# Patient Record
Sex: Female | Born: 1935 | Race: White | Hispanic: No
Health system: Southern US, Community
[De-identification: ages and names within clinical notes are randomized; demographics above are authoritative.]

---

## 2005-07-26 ENCOUNTER — Ambulatory Visit: Payer: Self-pay | Admitting: Internal Medicine

## 2005-11-04 ENCOUNTER — Ambulatory Visit: Payer: Self-pay | Admitting: Internal Medicine

## 2006-08-04 ENCOUNTER — Ambulatory Visit: Payer: Self-pay | Admitting: Internal Medicine

## 2008-07-08 ENCOUNTER — Other Ambulatory Visit: Payer: Self-pay

## 2008-07-08 ENCOUNTER — Emergency Department: Payer: Self-pay | Admitting: Emergency Medicine

## 2008-07-10 ENCOUNTER — Ambulatory Visit: Payer: Self-pay | Admitting: Internal Medicine

## 2009-03-27 ENCOUNTER — Ambulatory Visit: Payer: Self-pay | Admitting: Unknown Physician Specialty

## 2009-09-29 ENCOUNTER — Ambulatory Visit: Payer: Self-pay | Admitting: Internal Medicine

## 2009-12-02 ENCOUNTER — Ambulatory Visit: Payer: Self-pay | Admitting: Cardiology

## 2009-12-02 ENCOUNTER — Ambulatory Visit: Payer: Self-pay | Admitting: Ophthalmology

## 2009-12-14 ENCOUNTER — Ambulatory Visit: Payer: Self-pay | Admitting: Ophthalmology

## 2010-05-28 ENCOUNTER — Ambulatory Visit: Payer: Self-pay | Admitting: Internal Medicine

## 2010-10-22 ENCOUNTER — Ambulatory Visit: Payer: Self-pay | Admitting: Internal Medicine

## 2011-07-28 ENCOUNTER — Ambulatory Visit: Payer: Self-pay | Admitting: Internal Medicine

## 2013-03-29 ENCOUNTER — Emergency Department: Payer: Self-pay | Admitting: Emergency Medicine

## 2013-03-29 LAB — URINALYSIS, COMPLETE
Bacteria: NONE SEEN
Bilirubin,UR: NEGATIVE
Ph: 5 (ref 4.5–8.0)
RBC,UR: 7 /HPF (ref 0–5)
Specific Gravity: 1.023 (ref 1.003–1.030)
Squamous Epithelial: NONE SEEN

## 2013-03-29 LAB — BASIC METABOLIC PANEL
Chloride: 109 mmol/L — ABNORMAL HIGH (ref 98–107)
Co2: 27 mmol/L (ref 21–32)
Creatinine: 1.9 mg/dL — ABNORMAL HIGH (ref 0.60–1.30)
EGFR (African American): 29 — ABNORMAL LOW
Sodium: 142 mmol/L (ref 136–145)

## 2013-03-29 LAB — HEPATIC FUNCTION PANEL A (ARMC)
Albumin: 3.3 g/dL — ABNORMAL LOW (ref 3.4–5.0)
Bilirubin,Total: 0.9 mg/dL (ref 0.2–1.0)

## 2013-03-29 LAB — CBC
HGB: 11.9 g/dL — ABNORMAL LOW (ref 12.0–16.0)
Platelet: 172 10*3/uL (ref 150–440)
RDW: 13.4 % (ref 11.5–14.5)

## 2013-03-29 LAB — LIPASE, BLOOD: Lipase: 105 U/L (ref 73–393)

## 2013-04-04 LAB — CULTURE, BLOOD (SINGLE)

## 2013-08-11 ENCOUNTER — Emergency Department: Payer: Self-pay | Admitting: Emergency Medicine

## 2013-08-11 LAB — COMPREHENSIVE METABOLIC PANEL
Albumin: 3.5 g/dL (ref 3.4–5.0)
Alkaline Phosphatase: 76 U/L (ref 50–136)
Anion Gap: 5 — ABNORMAL LOW (ref 7–16)
BUN: 28 mg/dL — ABNORMAL HIGH (ref 7–18)
Bilirubin,Total: 0.4 mg/dL (ref 0.2–1.0)
Calcium, Total: 8.8 mg/dL (ref 8.5–10.1)
Chloride: 110 mmol/L — ABNORMAL HIGH (ref 98–107)
Creatinine: 1.95 mg/dL — ABNORMAL HIGH (ref 0.60–1.30)
EGFR (African American): 27 — ABNORMAL LOW
Glucose: 151 mg/dL — ABNORMAL HIGH (ref 65–99)
SGOT(AST): 22 U/L (ref 15–37)
SGPT (ALT): 13 U/L (ref 12–78)
Sodium: 144 mmol/L (ref 136–145)

## 2013-08-11 LAB — CBC
MCH: 32.2 pg (ref 26.0–34.0)
Platelet: 194 10*3/uL (ref 150–440)
RDW: 13.8 % (ref 11.5–14.5)
WBC: 9.5 10*3/uL (ref 3.6–11.0)

## 2013-08-11 LAB — PROTIME-INR
INR: 1.6
Prothrombin Time: 19.2 secs — ABNORMAL HIGH (ref 11.5–14.7)

## 2013-08-12 LAB — URINALYSIS, COMPLETE
Bilirubin,UR: NEGATIVE
Glucose,UR: NEGATIVE mg/dL (ref 0–75)
Nitrite: POSITIVE
Ph: 5 (ref 4.5–8.0)
Protein: 25
RBC,UR: 2 /HPF (ref 0–5)
WBC UR: 2 /HPF (ref 0–5)

## 2013-09-19 ENCOUNTER — Inpatient Hospital Stay: Payer: Self-pay | Admitting: Internal Medicine

## 2013-09-19 LAB — COMPREHENSIVE METABOLIC PANEL
Albumin: 3.5 g/dL (ref 3.4–5.0)
Alkaline Phosphatase: 84 U/L (ref 50–136)
Anion Gap: 9 (ref 7–16)
BUN: 29 mg/dL — ABNORMAL HIGH (ref 7–18)
Calcium, Total: 8.8 mg/dL (ref 8.5–10.1)
Chloride: 110 mmol/L — ABNORMAL HIGH (ref 98–107)
Co2: 20 mmol/L — ABNORMAL LOW (ref 21–32)
Creatinine: 1.53 mg/dL — ABNORMAL HIGH (ref 0.60–1.30)
EGFR (African American): 37 — ABNORMAL LOW
EGFR (Non-African Amer.): 32 — ABNORMAL LOW
Glucose: 104 mg/dL — ABNORMAL HIGH (ref 65–99)
Osmolality: 284 (ref 275–301)
Sodium: 139 mmol/L (ref 136–145)

## 2013-09-19 LAB — PROTIME-INR
INR: 3.7
Prothrombin Time: 35 secs — ABNORMAL HIGH (ref 11.5–14.7)

## 2013-09-19 LAB — CBC WITH DIFFERENTIAL/PLATELET
Basophil #: 0.1 10*3/uL (ref 0.0–0.1)
Eosinophil #: 0.1 10*3/uL (ref 0.0–0.7)
Eosinophil %: 1 %
MCH: 32.5 pg (ref 26.0–34.0)
MCHC: 34 g/dL (ref 32.0–36.0)
MCV: 96 fL (ref 80–100)
Monocyte %: 8.8 %
Neutrophil #: 5.9 10*3/uL (ref 1.4–6.5)
Neutrophil %: 69.2 %
RBC: 4.02 10*6/uL (ref 3.80–5.20)
WBC: 8.5 10*3/uL (ref 3.6–11.0)

## 2013-09-19 LAB — CK TOTAL AND CKMB (NOT AT ARMC): CK-MB: 1.6 ng/mL (ref 0.5–3.6)

## 2013-09-19 LAB — TROPONIN I: Troponin-I: <0.02 ng/mL

## 2013-09-19 LAB — TSH: Thyroid Stimulating Horm: 0.744 u[IU]/mL

## 2013-09-20 ENCOUNTER — Ambulatory Visit: Payer: Self-pay | Admitting: Hospice and Palliative Medicine

## 2013-09-20 LAB — CBC WITH DIFFERENTIAL/PLATELET
Eosinophil #: 0.1 10*3/uL (ref 0.0–0.7)
HCT: 35 % (ref 35.0–47.0)
HGB: 12 g/dL (ref 12.0–16.0)
MCH: 32.5 pg (ref 26.0–34.0)
MCHC: 34.3 g/dL (ref 32.0–36.0)
MCV: 95 fL (ref 80–100)
Neutrophil %: 52.8 %
RBC: 3.69 10*6/uL — ABNORMAL LOW (ref 3.80–5.20)
RDW: 13.8 % (ref 11.5–14.5)
WBC: 6.8 10*3/uL (ref 3.6–11.0)

## 2013-09-20 LAB — TROPONIN I: Troponin-I: <0.02 ng/mL

## 2013-09-20 LAB — URINALYSIS, COMPLETE
Bilirubin,UR: NEGATIVE
Blood: NEGATIVE
Glucose,UR: NEGATIVE mg/dL (ref 0–75)
Nitrite: NEGATIVE
Ph: 7 (ref 4.5–8.0)
Protein: NEGATIVE
RBC,UR: <1 /HPF (ref 0–5)
Specific Gravity: 1.003 (ref 1.003–1.030)
Squamous Epithelial: <1

## 2013-09-20 LAB — BASIC METABOLIC PANEL
Anion Gap: 4 — ABNORMAL LOW (ref 7–16)
BUN: 29 mg/dL — ABNORMAL HIGH (ref 7–18)
EGFR (African American): 33 — ABNORMAL LOW
EGFR (Non-African Amer.): 28 — ABNORMAL LOW
Glucose: 74 mg/dL (ref 65–99)
Osmolality: 291 (ref 275–301)
Potassium: 4.4 mmol/L (ref 3.5–5.1)
Sodium: 144 mmol/L (ref 136–145)

## 2013-09-20 LAB — PROTIME-INR
INR: 3.8
Prothrombin Time: 36.1 secs — ABNORMAL HIGH (ref 11.5–14.7)

## 2013-09-20 LAB — CK TOTAL AND CKMB (NOT AT ARMC)
CK, Total: 43 U/L (ref 21–215)
CK-MB: 1.2 ng/mL (ref 0.5–3.6)

## 2013-09-21 LAB — URINE CULTURE

## 2013-10-19 ENCOUNTER — Ambulatory Visit: Payer: Self-pay | Admitting: Hospice and Palliative Medicine

## 2014-05-16 ENCOUNTER — Inpatient Hospital Stay: Payer: Self-pay | Admitting: Internal Medicine

## 2014-05-16 LAB — COMPREHENSIVE METABOLIC PANEL
Albumin: 4 g/dL (ref 3.4–5.0)
Alkaline Phosphatase: 82 U/L
Anion Gap: 3 — ABNORMAL LOW (ref 7–16)
BILIRUBIN TOTAL: 0.7 mg/dL (ref 0.2–1.0)
BUN: 32 mg/dL — ABNORMAL HIGH (ref 7–18)
CALCIUM: 9.1 mg/dL (ref 8.5–10.1)
Chloride: 109 mmol/L — ABNORMAL HIGH (ref 98–107)
Co2: 29 mmol/L (ref 21–32)
Creatinine: 1.67 mg/dL — ABNORMAL HIGH (ref 0.60–1.30)
EGFR (African American): 33 — ABNORMAL LOW
GFR CALC NON AF AMER: 29 — AB
Glucose: 110 mg/dL — ABNORMAL HIGH (ref 65–99)
Osmolality: 289 (ref 275–301)
Potassium: 4.5 mmol/L (ref 3.5–5.1)
SGOT(AST): 62 U/L — ABNORMAL HIGH (ref 15–37)
SGPT (ALT): 24 U/L (ref 12–78)
Sodium: 141 mmol/L (ref 136–145)
Total Protein: 7.3 g/dL (ref 6.4–8.2)

## 2014-05-16 LAB — CBC
HCT: 37.2 % (ref 35.0–47.0)
HGB: 12.3 g/dL (ref 12.0–16.0)
MCH: 32.8 pg (ref 26.0–34.0)
MCHC: 33.1 g/dL (ref 32.0–36.0)
MCV: 99 fL (ref 80–100)
Platelet: 177 10*3/uL (ref 150–440)
RBC: 3.77 10*6/uL — ABNORMAL LOW (ref 3.80–5.20)
RDW: 13.8 % (ref 11.5–14.5)
WBC: 10.1 10*3/uL (ref 3.6–11.0)

## 2014-05-16 LAB — URINALYSIS, COMPLETE
Bilirubin,UR: NEGATIVE
GLUCOSE, UR: NEGATIVE mg/dL (ref 0–75)
Hyaline Cast: 3
Nitrite: NEGATIVE
Ph: 5 (ref 4.5–8.0)
SPECIFIC GRAVITY: 1.027 (ref 1.003–1.030)
Squamous Epithelial: <1
WBC UR: 22 /HPF (ref 0–5)

## 2014-05-16 LAB — TROPONIN I
Troponin-I: 0.05 ng/mL
Troponin-I: 0.06 ng/mL — ABNORMAL HIGH
Troponin-I: 0.05 ng/mL

## 2014-05-16 LAB — CK TOTAL AND CKMB (NOT AT ARMC)
CK, Total: 598 U/L — ABNORMAL HIGH
CK, Total: 482 U/L — ABNORMAL HIGH
CK-MB: 6.2 ng/mL — ABNORMAL HIGH (ref 0.5–3.6)
CK-MB: 5.5 ng/mL — ABNORMAL HIGH (ref 0.5–3.6)

## 2014-05-17 LAB — BASIC METABOLIC PANEL
Anion Gap: 6 — ABNORMAL LOW (ref 7–16)
BUN: 30 mg/dL — ABNORMAL HIGH (ref 7–18)
Calcium, Total: 8.4 mg/dL — ABNORMAL LOW (ref 8.5–10.1)
Chloride: 112 mmol/L — ABNORMAL HIGH (ref 98–107)
Co2: 27 mmol/L (ref 21–32)
Creatinine: 1.34 mg/dL — ABNORMAL HIGH (ref 0.60–1.30)
EGFR (African American): 43 — ABNORMAL LOW
EGFR (Non-African Amer.): 37 — ABNORMAL LOW
Glucose: 84 mg/dL (ref 65–99)
Osmolality: 294 (ref 275–301)
Potassium: 3.7 mmol/L (ref 3.5–5.1)
Sodium: 145 mmol/L (ref 136–145)

## 2014-05-17 LAB — CBC WITH DIFFERENTIAL/PLATELET
BASOS ABS: 0.1 10*3/uL (ref 0.0–0.1)
Basophil %: 1.3 %
Eosinophil #: 0.1 10*3/uL (ref 0.0–0.7)
Eosinophil %: 1.1 %
HCT: 34.8 % — AB (ref 35.0–47.0)
HGB: 11.3 g/dL — ABNORMAL LOW (ref 12.0–16.0)
LYMPHS ABS: 2.6 10*3/uL (ref 1.0–3.6)
LYMPHS PCT: 28.5 %
MCH: 32.2 pg (ref 26.0–34.0)
MCHC: 32.6 g/dL (ref 32.0–36.0)
MCV: 99 fL (ref 80–100)
MONOS PCT: 9.7 %
Monocyte #: 0.9 x10 3/mm (ref 0.2–0.9)
Neutrophil #: 5.4 10*3/uL (ref 1.4–6.5)
Neutrophil %: 59.4 %
Platelet: 176 10*3/uL (ref 150–440)
RBC: 3.53 10*6/uL — AB (ref 3.80–5.20)
RDW: 13.6 % (ref 11.5–14.5)
WBC: 9.1 10*3/uL (ref 3.6–11.0)

## 2014-05-18 LAB — BASIC METABOLIC PANEL
Anion Gap: 4 — ABNORMAL LOW (ref 7–16)
BUN: 26 mg/dL — ABNORMAL HIGH (ref 7–18)
CALCIUM: 8.1 mg/dL — AB (ref 8.5–10.1)
CO2: 27 mmol/L (ref 21–32)
Chloride: 112 mmol/L — ABNORMAL HIGH (ref 98–107)
Creatinine: 1.36 mg/dL — ABNORMAL HIGH (ref 0.60–1.30)
EGFR (Non-African Amer.): 37 — ABNORMAL LOW
GFR CALC AF AMER: 42 — AB
GLUCOSE: 95 mg/dL (ref 65–99)
OSMOLALITY: 290 (ref 275–301)
POTASSIUM: 3.5 mmol/L (ref 3.5–5.1)
SODIUM: 143 mmol/L (ref 136–145)

## 2014-05-18 LAB — CBC WITH DIFFERENTIAL/PLATELET
BASOS ABS: 0.1 10*3/uL (ref 0.0–0.1)
BASOS PCT: 0.7 %
Eosinophil #: 0.1 10*3/uL (ref 0.0–0.7)
Eosinophil %: 0.7 %
HCT: 35 % (ref 35.0–47.0)
HGB: 11.4 g/dL — AB (ref 12.0–16.0)
LYMPHS PCT: 13.1 %
Lymphocyte #: 1.5 10*3/uL (ref 1.0–3.6)
MCH: 32.3 pg (ref 26.0–34.0)
MCHC: 32.6 g/dL (ref 32.0–36.0)
MCV: 99 fL (ref 80–100)
MONO ABS: 1.2 x10 3/mm — AB (ref 0.2–0.9)
MONOS PCT: 10.9 %
Neutrophil #: 8.4 10*3/uL — ABNORMAL HIGH (ref 1.4–6.5)
Neutrophil %: 74.6 %
Platelet: 177 10*3/uL (ref 150–440)
RBC: 3.54 10*6/uL — AB (ref 3.80–5.20)
RDW: 13.3 % (ref 11.5–14.5)
WBC: 11.3 10*3/uL — AB (ref 3.6–11.0)

## 2014-05-18 LAB — URINE CULTURE

## 2014-05-19 ENCOUNTER — Ambulatory Visit: Payer: Self-pay | Admitting: Internal Medicine

## 2014-05-19 ENCOUNTER — Ambulatory Visit
Admit: 2014-05-19 | Disposition: A | Discharge status: Home / Self Care | Payer: Self-pay | Attending: Nurse Practitioner | Admitting: Nurse Practitioner

## 2014-05-19 LAB — CBC WITH DIFFERENTIAL/PLATELET
Basophil #: 0.3 10*3/uL — ABNORMAL HIGH (ref 0.0–0.1)
Basophil %: 3.3 %
Eosinophil #: 0.1 10*3/uL (ref 0.0–0.7)
Eosinophil %: 1.1 %
HCT: 34.1 % — ABNORMAL LOW (ref 35.0–47.0)
HGB: 11.3 g/dL — ABNORMAL LOW (ref 12.0–16.0)
LYMPHS ABS: 1.7 10*3/uL (ref 1.0–3.6)
LYMPHS PCT: 18.2 %
MCH: 32.3 pg (ref 26.0–34.0)
MCHC: 33.1 g/dL (ref 32.0–36.0)
MCV: 98 fL (ref 80–100)
Monocyte #: 1 x10 3/mm — ABNORMAL HIGH (ref 0.2–0.9)
Monocyte %: 10.9 %
NEUTROS ABS: 6.2 10*3/uL (ref 1.4–6.5)
Neutrophil %: 66.5 %
Platelet: 179 10*3/uL (ref 150–440)
RBC: 3.5 10*6/uL — AB (ref 3.80–5.20)
RDW: 13.3 % (ref 11.5–14.5)
WBC: 9.3 10*3/uL (ref 3.6–11.0)

## 2014-05-19 LAB — BASIC METABOLIC PANEL
Anion Gap: 4 — ABNORMAL LOW (ref 7–16)
BUN: 19 mg/dL — ABNORMAL HIGH (ref 7–18)
Calcium, Total: 8.2 mg/dL — ABNORMAL LOW (ref 8.5–10.1)
Chloride: 112 mmol/L — ABNORMAL HIGH (ref 98–107)
Co2: 27 mmol/L (ref 21–32)
Creatinine: 1.23 mg/dL (ref 0.60–1.30)
EGFR (African American): 48 — ABNORMAL LOW
EGFR (Non-African Amer.): 41 — ABNORMAL LOW
Glucose: 97 mg/dL (ref 65–99)
Osmolality: 287 (ref 275–301)
POTASSIUM: 3.5 mmol/L (ref 3.5–5.1)
SODIUM: 143 mmol/L (ref 136–145)

## 2014-05-20 ENCOUNTER — Ambulatory Visit: Payer: Self-pay | Admitting: Neurology

## 2014-05-20 LAB — BASIC METABOLIC PANEL
ANION GAP: 8 (ref 7–16)
BUN: 17 mg/dL (ref 7–18)
CALCIUM: 8.5 mg/dL (ref 8.5–10.1)
CHLORIDE: 108 mmol/L — AB (ref 98–107)
CREATININE: 1.03 mg/dL (ref 0.60–1.30)
Co2: 26 mmol/L (ref 21–32)
EGFR (African American): 59 — ABNORMAL LOW
GFR CALC NON AF AMER: 51 — AB
GLUCOSE: 100 mg/dL — AB (ref 65–99)
OSMOLALITY: 285 (ref 275–301)
Potassium: 3.5 mmol/L (ref 3.5–5.1)
Sodium: 142 mmol/L (ref 136–145)

## 2014-05-21 ENCOUNTER — Encounter: Payer: Self-pay | Admitting: Internal Medicine

## 2014-05-21 LAB — BASIC METABOLIC PANEL
Anion Gap: 8 (ref 7–16)
BUN: 17 mg/dL (ref 7–18)
CHLORIDE: 109 mmol/L — AB (ref 98–107)
Calcium, Total: 8.4 mg/dL — ABNORMAL LOW (ref 8.5–10.1)
Co2: 25 mmol/L (ref 21–32)
Creatinine: 1 mg/dL (ref 0.60–1.30)
EGFR (African American): >60
EGFR (Non-African Amer.): 53 — ABNORMAL LOW
Glucose: 80 mg/dL (ref 65–99)
OSMOLALITY: 284 (ref 275–301)
Potassium: 3.5 mmol/L (ref 3.5–5.1)
Sodium: 142 mmol/L (ref 136–145)

## 2014-05-21 LAB — CBC WITH DIFFERENTIAL/PLATELET
BASOS PCT: 3.4 %
Basophil #: 0.3 10*3/uL — ABNORMAL HIGH (ref 0.0–0.1)
EOS ABS: 0.2 10*3/uL (ref 0.0–0.7)
Eosinophil %: 2 %
HCT: 33.3 % — AB (ref 35.0–47.0)
HGB: 11.3 g/dL — AB (ref 12.0–16.0)
LYMPHS PCT: 20.8 %
Lymphocyte #: 1.6 10*3/uL (ref 1.0–3.6)
MCH: 33 pg (ref 26.0–34.0)
MCHC: 33.8 g/dL (ref 32.0–36.0)
MCV: 98 fL (ref 80–100)
MONO ABS: 1 x10 3/mm — AB (ref 0.2–0.9)
Monocyte %: 13 %
NEUTROS PCT: 60.8 %
Neutrophil #: 4.6 10*3/uL (ref 1.4–6.5)
Platelet: 178 10*3/uL (ref 150–440)
RBC: 3.41 10*6/uL — ABNORMAL LOW (ref 3.80–5.20)
RDW: 13.1 % (ref 11.5–14.5)
WBC: 7.6 10*3/uL (ref 3.6–11.0)

## 2014-06-12 ENCOUNTER — Emergency Department: Payer: Self-pay | Admitting: Emergency Medicine

## 2014-06-12 LAB — URINALYSIS, COMPLETE
Bilirubin,UR: NEGATIVE
Blood: NEGATIVE
GLUCOSE, UR: NEGATIVE mg/dL (ref 0–75)
Hyaline Cast: 2
Leukocyte Esterase: NEGATIVE
Nitrite: NEGATIVE
PROTEIN: NEGATIVE
Ph: 6 (ref 4.5–8.0)
RBC, UR: NONE SEEN /HPF (ref 0–5)
Specific Gravity: 1.016 (ref 1.003–1.030)
Squamous Epithelial: NONE SEEN
WBC UR: NONE SEEN /HPF (ref 0–5)

## 2014-06-12 LAB — CBC
HCT: 34.5 % — AB (ref 35.0–47.0)
HGB: 11.5 g/dL — AB (ref 12.0–16.0)
MCH: 32.9 pg (ref 26.0–34.0)
MCHC: 33.3 g/dL (ref 32.0–36.0)
MCV: 99 fL (ref 80–100)
PLATELETS: 195 10*3/uL (ref 150–440)
RBC: 3.49 10*6/uL — ABNORMAL LOW (ref 3.80–5.20)
RDW: 13.4 % (ref 11.5–14.5)
WBC: 9.4 10*3/uL (ref 3.6–11.0)

## 2014-06-12 LAB — BASIC METABOLIC PANEL
Anion Gap: 6 — ABNORMAL LOW (ref 7–16)
BUN: 31 mg/dL — AB (ref 7–18)
Calcium, Total: 9 mg/dL (ref 8.5–10.1)
Chloride: 108 mmol/L — ABNORMAL HIGH (ref 98–107)
Co2: 29 mmol/L (ref 21–32)
Creatinine: 1.43 mg/dL — ABNORMAL HIGH (ref 0.60–1.30)
EGFR (African American): 40 — ABNORMAL LOW
EGFR (Non-African Amer.): 34 — ABNORMAL LOW
Glucose: 89 mg/dL (ref 65–99)
OSMOLALITY: 291 (ref 275–301)
POTASSIUM: 4.1 mmol/L (ref 3.5–5.1)
SODIUM: 143 mmol/L (ref 136–145)

## 2014-06-18 ENCOUNTER — Ambulatory Visit: Payer: Self-pay | Admitting: Internal Medicine

## 2014-06-19 ENCOUNTER — Inpatient Hospital Stay: Payer: Self-pay | Admitting: Internal Medicine

## 2014-06-19 LAB — CK TOTAL AND CKMB (NOT AT ARMC)
CK, TOTAL: 1847 U/L — AB
CK-MB: 8.4 ng/mL — ABNORMAL HIGH (ref 0.5–3.6)

## 2014-06-19 LAB — CBC
HCT: 31.6 % — ABNORMAL LOW (ref 35.0–47.0)
HGB: 10.6 g/dL — ABNORMAL LOW (ref 12.0–16.0)
MCH: 33 pg (ref 26.0–34.0)
MCHC: 33.6 g/dL (ref 32.0–36.0)
MCV: 98 fL (ref 80–100)
Platelet: 184 10*3/uL (ref 150–440)
RBC: 3.22 10*6/uL — AB (ref 3.80–5.20)
RDW: 13.7 % (ref 11.5–14.5)
WBC: 10.9 10*3/uL (ref 3.6–11.0)

## 2014-06-19 LAB — URINALYSIS, COMPLETE
Bacteria: NONE SEEN
Bilirubin,UR: NEGATIVE
Blood: NEGATIVE
Glucose,UR: NEGATIVE mg/dL (ref 0–75)
LEUKOCYTE ESTERASE: NEGATIVE
NITRITE: NEGATIVE
Ph: 5 (ref 4.5–8.0)
Protein: 30
RBC,UR: 1 /HPF (ref 0–5)
Specific Gravity: 1.026 (ref 1.003–1.030)
Squamous Epithelial: NONE SEEN

## 2014-06-19 LAB — TROPONIN I
Troponin-I: 0.02 ng/mL
Troponin-I: 0.02 ng/mL
Troponin-I: 0.02 ng/mL

## 2014-06-19 LAB — COMPREHENSIVE METABOLIC PANEL
ALBUMIN: 2.5 g/dL — AB (ref 3.4–5.0)
Alkaline Phosphatase: 73 U/L
Anion Gap: 7 (ref 7–16)
BILIRUBIN TOTAL: 1 mg/dL (ref 0.2–1.0)
BUN: 23 mg/dL — AB (ref 7–18)
CO2: 27 mmol/L (ref 21–32)
Calcium, Total: 7.9 mg/dL — ABNORMAL LOW (ref 8.5–10.1)
Chloride: 113 mmol/L — ABNORMAL HIGH (ref 98–107)
Creatinine: 1.53 mg/dL — ABNORMAL HIGH (ref 0.60–1.30)
EGFR (Non-African Amer.): 32 — ABNORMAL LOW
GFR CALC AF AMER: 37 — AB
GLUCOSE: 112 mg/dL — AB (ref 65–99)
OSMOLALITY: 297 (ref 275–301)
POTASSIUM: 3.5 mmol/L (ref 3.5–5.1)
SGOT(AST): 95 U/L — ABNORMAL HIGH (ref 15–37)
SGPT (ALT): 20 U/L (ref 12–78)
Sodium: 147 mmol/L — ABNORMAL HIGH (ref 136–145)
Total Protein: 5.7 g/dL — ABNORMAL LOW (ref 6.4–8.2)

## 2014-06-19 LAB — CK-MB
CK-MB: 8.3 ng/mL — ABNORMAL HIGH (ref 0.5–3.6)
CK-MB: 6.2 ng/mL — ABNORMAL HIGH (ref 0.5–3.6)
CK-MB: 4.6 ng/mL — ABNORMAL HIGH (ref 0.5–3.6)

## 2014-06-20 LAB — BASIC METABOLIC PANEL
Anion Gap: 8 (ref 7–16)
BUN: 20 mg/dL — ABNORMAL HIGH (ref 7–18)
CHLORIDE: 108 mmol/L — AB (ref 98–107)
CREATININE: 1.39 mg/dL — AB (ref 0.60–1.30)
Calcium, Total: 7.7 mg/dL — ABNORMAL LOW (ref 8.5–10.1)
Co2: 28 mmol/L (ref 21–32)
EGFR (African American): 41 — ABNORMAL LOW
EGFR (Non-African Amer.): 36 — ABNORMAL LOW
Glucose: 84 mg/dL (ref 65–99)
OSMOLALITY: 289 (ref 275–301)
Potassium: 3.3 mmol/L — ABNORMAL LOW (ref 3.5–5.1)
SODIUM: 144 mmol/L (ref 136–145)

## 2014-06-21 LAB — BASIC METABOLIC PANEL
ANION GAP: 8 (ref 7–16)
BUN: 20 mg/dL — AB (ref 7–18)
CO2: 28 mmol/L (ref 21–32)
Calcium, Total: 8.1 mg/dL — ABNORMAL LOW (ref 8.5–10.1)
Chloride: 107 mmol/L (ref 98–107)
Creatinine: 1.26 mg/dL (ref 0.60–1.30)
EGFR (African American): 47 — ABNORMAL LOW
EGFR (Non-African Amer.): 40 — ABNORMAL LOW
GLUCOSE: 103 mg/dL — AB (ref 65–99)
OSMOLALITY: 288 (ref 275–301)
POTASSIUM: 3.3 mmol/L — AB (ref 3.5–5.1)
Sodium: 143 mmol/L (ref 136–145)

## 2014-06-22 LAB — BASIC METABOLIC PANEL WITH GFR
Anion Gap: 7
BUN: 16 mg/dL
Calcium, Total: 8.4 mg/dL — ABNORMAL LOW
Chloride: 109 mmol/L — ABNORMAL HIGH
Co2: 29 mmol/L
Creatinine: 1.21 mg/dL
EGFR (African American): 49 — ABNORMAL LOW
EGFR (Non-African Amer.): 42 — ABNORMAL LOW
Glucose: 97 mg/dL
Osmolality: 290
Potassium: 3.6 mmol/L
Sodium: 145 mmol/L

## 2014-06-22 LAB — CBC WITH DIFFERENTIAL/PLATELET
Basophil #: 0.1 x10 3/mm 3
Basophil %: 1.2 %
Eosinophil #: 0.2 x10 3/mm 3
Eosinophil %: 2.2 %
HCT: 34.2 % — ABNORMAL LOW
HGB: 11.4 g/dL — ABNORMAL LOW
Lymphocyte %: 22.1 %
Lymphs Abs: 1.6 x10 3/mm 3
MCH: 32.6 pg
MCHC: 33.4 g/dL
MCV: 98 fL
Monocyte #: 0.8 "x10 3/mm "
Monocyte %: 10.6 %
Neutrophil #: 4.7 x10 3/mm 3
Neutrophil %: 63.9 %
Platelet: 232 x10 3/mm 3
RBC: 3.51 X10 6/mm 3 — ABNORMAL LOW
RDW: 13.3 %
WBC: 7.4 x10 3/mm 3

## 2014-06-22 LAB — CK: CK, Total: 537 U/L — ABNORMAL HIGH

## 2014-06-23 LAB — BASIC METABOLIC PANEL
Anion Gap: 6 — ABNORMAL LOW (ref 7–16)
BUN: 15 mg/dL (ref 7–18)
CO2: 30 mmol/L (ref 21–32)
CREATININE: 1.19 mg/dL (ref 0.60–1.30)
Calcium, Total: 8.3 mg/dL — ABNORMAL LOW (ref 8.5–10.1)
Chloride: 107 mmol/L (ref 98–107)
EGFR (African American): 50 — ABNORMAL LOW
GFR CALC NON AF AMER: 43 — AB
Glucose: 87 mg/dL (ref 65–99)
Osmolality: 285 (ref 275–301)
POTASSIUM: 3.6 mmol/L (ref 3.5–5.1)
Sodium: 143 mmol/L (ref 136–145)

## 2014-06-23 LAB — CBC WITH DIFFERENTIAL/PLATELET
Basophil #: 0 10*3/uL (ref 0.0–0.1)
Basophil %: 0.4 %
EOS ABS: 0.2 10*3/uL (ref 0.0–0.7)
EOS PCT: 2 %
HCT: 34.7 % — ABNORMAL LOW (ref 35.0–47.0)
HGB: 11.5 g/dL — ABNORMAL LOW (ref 12.0–16.0)
LYMPHS ABS: 1.5 10*3/uL (ref 1.0–3.6)
LYMPHS PCT: 16.3 %
MCH: 32.3 pg (ref 26.0–34.0)
MCHC: 33.1 g/dL (ref 32.0–36.0)
MCV: 98 fL (ref 80–100)
MONO ABS: 0.9 x10 3/mm (ref 0.2–0.9)
Monocyte %: 10.4 %
NEUTROS ABS: 6.4 10*3/uL (ref 1.4–6.5)
Neutrophil %: 70.9 %
Platelet: 247 10*3/uL (ref 150–440)
RBC: 3.56 10*6/uL — AB (ref 3.80–5.20)
RDW: 13.2 % (ref 11.5–14.5)
WBC: 9.1 10*3/uL (ref 3.6–11.0)

## 2014-06-24 LAB — BASIC METABOLIC PANEL
Anion Gap: 4 — ABNORMAL LOW (ref 7–16)
BUN: 16 mg/dL (ref 7–18)
CHLORIDE: 110 mmol/L — AB (ref 98–107)
Calcium, Total: 8.2 mg/dL — ABNORMAL LOW (ref 8.5–10.1)
Co2: 29 mmol/L (ref 21–32)
Creatinine: 1.15 mg/dL (ref 0.60–1.30)
EGFR (Non-African Amer.): 45 — ABNORMAL LOW
GFR CALC AF AMER: 52 — AB
Glucose: 86 mg/dL (ref 65–99)
OSMOLALITY: 285 (ref 275–301)
POTASSIUM: 3.6 mmol/L (ref 3.5–5.1)
SODIUM: 143 mmol/L (ref 136–145)

## 2014-07-19 ENCOUNTER — Ambulatory Visit: Payer: Self-pay | Admitting: Internal Medicine

## 2015-04-10 NOTE — H&P (Signed)
PATIENT NAME:  [name], Keiandra S MR#:  [id number] DATE OF BIRTH:  Apr 27, 1931  DATE OF ADMISSION:  09/19/2013  CHIEF COMPLAINT: Confusion, cough.  HISTORY OF PRESENT ILLNESS: An 81 year old female with a history of hypertension, chronic kidney disease stage III, atrial fibrillation with progressive dementia, as well as parkinsonism. She is followed by home health, and fairly recently had been having trouble with hypertension.   Over the last several days she has had some progression of confusion. She has had some increasing cough and congestion, and this seems generally weaker. She was brought to our office today for a routine check. The patient complains of no energy, has a congested cough here, and blood pressure is noted to be 72/60, similar to that on recheck. She is admitted now with hypotension, confusion, possible underlying pneumonia.   PAST MEDICAL HISTORY: 1.  Hypertension.  2.  Gout.  3.  Chronic kidney disease, stage III.  4.  Hyperlipidemia.  5.  Osteopenia.  6.  Atrial fibrillation, on chronic Coumadin therapy.  7.  Osteoarthritis, with chronic pain.  8.  Dementia, likely Alzheimer's type.  9.  Parkinsonism.  10.  History of knee replacement.  11.  History of thumb surgery.  12.  Status post hysterectomy, with appendectomy.   ALLERGIES: Codeine and Vicodin.   MEDICATIONS: 1.  Losartan 100 mg p.o. daily.  2.  Toprol-XL 50 mg p.o. daily.  3.  Fish oil 1000 mg p.o. daily.  4.  Vitamin D 1000 units p.o. daily.  5.  Buspirone 15 mg p.o. b.i.d. p.r.n., anxiety.  6.  Aricept 10 mg p.o. daily.  7.  Coumadin 3.5 mg p.o. daily.  8.  Seroquel 25 mg p.o. at bedtime.  9.  Sinemet 25/100 mg, 1 tablet p.o. t.i.d.  10.  Namenda XR 20 mg p.o. daily.  11.  Amlodipine 10 mg p.o. daily.   SOCIAL HISTORY: No alcohol or tobacco.   FAMILY HISTORY: Father with tuberculosis when the patient was young.   REVIEW OF SYSTEMS: Please see HPI. Has felt chilled; no true fevers. No changes in  bowels, bladder. The patient's dementia limits her ability to provide further information.   PHYSICAL EXAMINATION: Weight is 124, blood pressure 72/60, temperature 97.6, pulse 51, oxygen saturation 97%.  GENERAL: Pale-appearing elderly female, confused and appears ill.  EYES: Pupils are round and reactive to light. Lids and conjunctivae unremarkable.  EARS, NOSE, AND THROAT: Decreased facial expression.  OROPHARYNX: Slightly dry, without lesions.  NECK: Increased muscle tension, with trachea in midline. No thyromegaly.  CARDIOVASCULAR: Irregularly irregular, without gallops or rubs. Carotid and radial pulses are 1+.  LUNGS: Poor air flow into the bases bilaterally, with suboptimal effort from the patient. Oxygen saturation 97% on room air.  ABDOMEN: Soft, nontender, positive bowel sounds. No guard or rebound.  SKIN: Decreased skin turgor. No significant rashes.  LYMPH NODES: No cervical or supraclavicular nodes.  MUSCULOSKELETAL: No clubbing or cyanosis. No edema.  NEUROLOGIC: Decreased hearing. Decreased facial expression. Generalized weakness, with poor cooperation.   IMPRESSION AND PLAN: 1.  Hypertension/altered mental status: Will initiate antibiotic therapy and obtain blood cultures first, obtain a chest x-ray as well to evaluate for pneumonia. Hydrate for now to improve blood pressure and hold all blood pressure medications. Check electrolytes, thyroid, and address if necessary.  2.  Chronic disease, stage III: Can see what her creatinine looks like and adjustment medicines from there.  3.  Atrial fibrillation: I will need to hold her Toprol for now due to  both hypotension and bradycardia. Place on telemetry monitoring. She is maintained on Coumadin. Will hold this until we get her INR back. This should provide adequate protection from DVT prophylaxis as well.  4. Dementia: Caretakers understand to expect increased agitation at night. Continue her home medications, though will convert the  Namenda to standard Namenda, 10 mg b.i.d.  5.  Parkinson's. Continue her home medications, watching for hypotension.   DISCHARGE PLANNING: Will get a palliative care consultation.    ____________________________ Lynnea Ferrier, MD bjk:dm D: 09/19/2013 14:29:14 ET T: 09/19/2013 15:41:54 ET JOB#: [id number]  cc: Lynnea Ferrier, MD, <Dictator> Daniel Nones MD ELECTRONICALLY SIGNED 09/21/2013 12:47

## 2015-04-11 NOTE — Consult Note (Signed)
PATIENT NAME:  [name], [name] MR#:  [id number] DATE OF BIRTH:  1931/02/12  DATE OF CONSULTATION:  05/20/2014  REFERRING PHYSICIAN:   CONSULTING PHYSICIAN:  Pauletta BrownsYuriy Haruna Rohlfs, MD  HISTORY OF PRESENT ILLNESS:  Ms. [name] is an 81 year old female with past medical history of hypertension, chronic atrial fibrillation off Coumadin, chronic kidney disease, dementia, history of Parkinson'[name] disease on Sinemet, history of osteoporosis, presenting with worsening mental status. The patient was found to have a urinary tract infection, started on IV antibiotics. This morning mental status significantly got worse with dysarthria. There was a report of the patient looking to the left, suspected to have an infarct status post stat CT head did not show any acute intracranial abnormalities. The case was discussed with the patient'[name] son, who was at bedside. She resides with him and he has help with 24/7 supervision and home health care visiting nurse that comes in for a few hours a week.   PAST MEDICAL HISTORY: Dementia, Parkinson'[name] disease, CKD, chronic atrial fibrillation on Coumadin, hypertension, osteoporosis, osteoarthritis.   PAST SURGICAL HISTORY: Status post thumb surgery, status post hysterectomy, appendectomy and left knee replacement.   HOME MEDICATIONS: Include buspirone, Norvasc, Toprol, Sinemet, Namenda, donepezil, losartan.   SOCIAL HISTORY: Lives for at home with 24/7 assistance.  Lives at the patient'[name] son'[name] home in [place].  FAMILY HISTORY: Noncontributory.   REVIEW OF SYSTEMS: Unable to obtain at this time due to the patient'[name] condition.   LABORATORY, DIAGNOSTIC AND RADIOLOGICAL DATA:  Glucose is 97, BUN 19, creatinine is 1.23, sodium 143, potassium 3.5. Imaging: CAT scan the head no acute intracranial abnormalities.   PHYSICAL EXAMINATION:   VITAL SIGNS: Temperature 98.1, pulse 81, respirations 18, blood pressure 147/87, pulse oximetry 92% on room air.  NEUROLOGIC: The patient is able  to tell me her name. Could not tell me the date of birth. Could not hold a full conversation. Could not tell me her current location, the date or the time.  No apparent facial droop noted. Responds to visual threats bilaterally. Does follow commands, closes her eyes and shows me her right thumb. Tongue is midline, symmetrical. Motor strength appears to be 4 to 5 bilateral upper extremities and 3 out of 5 lower extremities. Coordination could not assess. Sensation intact. Reflexes diminished. Gait could not be assessed.   IMPRESSION: An 81 year old female with history of hypertension, atrial fibrillation off Coumadin, chronic kidney disease, history of Parkinson'[name] disease on Sinemet, presenting with altered mental status, urinary tract infection, worsening mental status this a.m. It appears the patient is back to the same condition that she has been in the past couple of days. I suspect this is a combination of metabolic encephalopathy due to urinary tract infection with history of dementia worsened by likely hypoactive delirium.   PLAN: Continue the hydration. Do not think the patient is having a stroke. Further palliative care evaluation and case management for possible future hospice evaluation. This case was discussed with nursing staff and patient'[name] son at bedside.   Thank you. It was which a pleasure seeing this patient.   ____________________________ Pauletta BrownsYuriy Tyeasha Ebbs, MD yz:cs D: 05/20/2014 18:16:16 ET T: 05/20/2014 18:31:02 ET JOB#: [id number]  cc: Pauletta BrownsYuriy Dhara Schepp, MD, <Dictator> Pauletta BrownsYURIY Fiora Weill MD ELECTRONICALLY SIGNED 06/05/2014 17:20

## 2015-04-11 NOTE — Discharge Summary (Signed)
PATIENT NAME:  Josue HectorRTHURS, Maitlyn S MR#:  [id number] DATE OF BIRTH:  [birth year]  DATE OF ADMISSION:  05/16/2014 DATE OF DISCHARGE:  05/21/2014  FINAL DIAGNOSES:  1.  Urinary tract infection.  2.  Metabolic encephalopathy secondary to #1.  3.  Advanced dementia.  4.  Hypertension, accelerated.  5.  Dehydration, resolved.  6.  Chronic kidney disease, stage III.  7.  Atrial fibrillation, not an anticoagulation candidate secondary to recurrent falls.  8.  Rhabdomyolysis, resolved.   HISTORY AND PHYSICAL: Please see dictated admission history and physical.   HOSPITAL COURSE: The patient was admitted with confusion, evidence of dehydration and urinary tract infection. Cultures revealing E. coli, resistant to ampicillin, but sensitive to all other antibiotics tested. She was converted over oral Cipro without issue.   Her mental status waxed and waned. On 05/20/14, she was much more somnolent, difficult to communicate with. Emergent CT of the head was performed, which revealed chronic changes without acute findings. Neurology consultation was obtained and they felt that this was just worsening delirium and metabolic encephalopathy in the face of severe dementia.   Palliative care and care management saw the patient as well. The decision was made to seek skilled nursing placement for the patient to attempt rehabilitation, but also to determine whether she would be able to return to an independent living situation. Hospice was discussed and it was felt that she was not yet a hospice candidate. If she has further deterioration in her mental status then this will certainly need to be entertained.   The patient is discharged to a nursing facility in stable condition with physical activity up with a walker with assistance as tolerated. Diet should be no added salt. Physical therapy and occupational therapy should evaluate and treat the patient. She will follow up with the nursing home physician.   DISCHARGE  MEDICATIONS:  1.  Donepezil 10 mg p.o. daily.  2.  Namenda XR 28 mg p.o. daily.  3.  Seroquel 25 mg p.o. at bedtime.  4.  Losartan 100 mg p.o. daily.  5.  Buspirone 15 mg 1/2 tablet p.o. b.i.d.  6.  Sinemet 25/100, 2 tablets p.o. t.i.d. for Parkinson's.  7.  Toprol-XL 25 mg p.o. daily.  8.  Aspirin 81 mg p.o. daily.  9.  Amlodipine 10 mg p.o. daily.  10. Cipro 500 mg p.o. b.i.d. x 4 days to complete a course of antibiotics.   CODE STATUS: The patient is DO NOT RESUSCITATE and an out of facility DNR form will be sent with her to the nursing facility.  TIME SPENT: 30 minutes.  ____________________________ Lynnea FerrierBert J. Sameera Betton III, MD bjk:aw D: 05/21/2014 07:23:37 ET T: 05/21/2014 07:37:11 ET JOB#: [id number]  cc: Lynnea FerrierBert J. Lunah Losasso III, MD, <Dictator> Daniel NonesBERT Jonet Mathies MD ELECTRONICALLY SIGNED 05/21/2014 8:12

## 2015-04-11 NOTE — H&P (Signed)
PATIENT NAME:  [name], [name] MR#:  [id number] DATE OF BIRTH:  [birth year]  DATE OF ADMISSION:  05/16/2014  ADMITTING PHYSICIAN: Gladstone Lighter, MD  PRIMARY CARE PHYSICIAN: Ramonita Lab, MD  CHIEF COMPLAINT: Fall and altered mental status.  HISTORY OF PRESENT ILLNESS: [name] is an 81 year old pleasant elderly Caucasian female with past medical history significant for hypertension, chronic atrial fibrillation, recently taken off of Coumadin, CKD stage III, dementia, parkinsonism, and osteoporosis who lives at home with caregivers and was brought in by her caregivers after she was noted to be on the floor beside her bed this morning. Most of the history is obtained from her caregiver at bedside who lives with the patient. According to her caregiver, the patient was put to bed last night and even the caregiver checked around 4:00 a.m. on the patient and the patient was lying in bed, and she came back after 8:00 this morning and noticed that the patient was by the bedside sitting on the floor, less responsive, and so was brought to the hospital. The patient is mostly wheelchair-bound and gets around the house with assistance, either with the help of a walker or in a wheelchair. She is usually conversational although pleasantly confused from her dementia, but the caregivers have noticed that for the last 4 days she has been preferring to lie mostly in bed and her responsiveness and alertness have also been decreasing and she has been sleeping most of the time. They have not noticed any fevers or chills, no nausea or vomiting, and no other complaints that the patient told them. It is also noticed that the patient is dehydrated and has a mild urinary tract infection on the labs.  PAST MEDICAL HISTORY: 1.  Dementia.  2.  Parkinsonism. 3.  CKD stage III.  4.  Chronic A-fib, off of Coumadin now.  5.  Hypertension.  6.  Osteoporosis.  7.  Osteoarthritis.   PAST SURGICAL HISTORY: 1.  Thumb surgery.   2.  Hysterectomy.  3.  Appendectomy.  4.  Left knee replacement surgery.   ALLERGIES TO MEDICATIONS: Reported as CODEINE AND VICODIN.  CURRENT HOME MEDICATIONS: 1.  Buspirone 15 mg tablet 1/2 to 1 tablet p.o. twice a day.  2.  Norvasc 5 mg p.o. daily.  3.  Toprol 25 mg p.o. daily.  4.  Sinemet 25/100 mg tablet 2 tablets 3 times a day.  5.  Namenda XR 28 mg p.o. daily.  6.  Donepezil 10 mg p.o. at bedtime.  7.  Losartan 100 mg p.o. daily.   SOCIAL HISTORY: The patient lives at home with caregivers 24/7. No smoking or alcohol use. The patient has 2 sons. One is living in [place], [place] and the other lives in [place].   FAMILY HISTORY: Not relevant at this time.   REVIEW OF SYSTEMS: Difficult to be obtained secondary to the patient's mental status.   PHYSICAL EXAMINATION: VITAL SIGNS: Temperature 97.6 degrees Fahrenheit, pulse 100, respirations 22, blood pressure 164/114, pulse ox 93% on room air.  GENERAL: Well-built, well-nourished, elderly female lying in bed, not responsive at this time.  HEENT: Normocephalic, atraumatic. Pupils are equal, round, and reacting to light. Anicteric sclerae. Oropharynx with very dry mucous membranes and ketotic smell noted, but no erythema, mass or exudates.  NECK: Supple. No thyromegaly, JVD or carotid bruits. No lymphadenopathy.  LUNGS: Moving air bilaterally. No wheeze or crackles. No use of accessory muscles for breathing.  HEART: Irregular rhythm and normal rate. No rubs or gallops.  No murmurs.  ABDOMEN: Soft, nontender, nondistended. No hepatosplenomegaly. Normal bowel sounds.  EXTREMITIES: No pedal edema. No clubbing or cyanosis. 2+ dorsalis pedis pulses palpable bilaterally.  SKIN: No acne, rash or lesions.  LYMPHATICS: No cervical lymphadenopathy.  NEUROLOGIC: No facial droop. The patient moving restless in bed, able to move all extremities but not following commands.  PSYCHOLOGIC: The patient is less responsive and disoriented at  this time.   DIAGNOSTIC DATA: WBC 10.1, hemoglobin 12.3, hematocrit 37.2, platelet count 177,000.   Sodium 141, potassium 4.5, chloride 109, bicarb 29, BUN 32, creatinine 1.67, glucose 110 and calcium 9.1.   ALT 24, AST 62, alk phos 82, total bili 0.7 and albumin 4. Troponin 0.05 on the first set and the second set being 0.06. CK 598, CK-MB 6.2.   Chest x-ray showing cardiomegaly with no congestive heart failure. No focal pulmonary infiltrate.   Pelvic x-ray showing slight symmetric narrowing of both hips. No fracture or dislocation.   CT of the head showing no intracranial abnormality, stable cerebral atrophy.   Urinalysis with 3+ bacteria, multiple WBCs and trace leukocyte esterase noted.   ASSESSMENT AND PLAN: This is an 81 year old female with history of dementia, parkinsonism, hypertension, atrial fibrillation, and chronic kidney disease brought after a fall and noted to be less responsive.  1.  Metabolic encephalopathy, likely from underlying urinary tract infection and dehydration. IV fluids have been ordered. Urine cultures sent for and started on antibiotics.  2.  Fall from unsteady gait. The patient tried to get up in the dark and could have been the reason for the fall. Physical therapy consult once more alert and likely will need home health. She already has home health in place.  3.  Elevated troponin, mildly elevated, likely demand ischemia from fall, rhabdomyolysis with elevated CPK as well. Less likely to be a myocardial infarction, however, we will monitor on telemetry and recycle cardiac enzymes.  4.  Chronic atrial fibrillation, rate controlled. Continue Toprol. Recently taken off of Coumadin. I agree, especially with her fall, and she is high risk for bleeding.  5.  Dementia. Pleasantly confused at baseline. Has caregivers at home. Continue Namenda and Aricept.   CODE STATUS: DO NOT RESUSCITATE.   TIME SPENT ON ADMISSION: 50 minutes.     ____________________________ Gladstone Lighter, MD rk:sb D: 05/16/2014 13:17:03 ET T: 05/16/2014 14:27:31 ET JOB#: [id number]  cc: Gladstone Lighter, MD, <Dictator> Gladstone Lighter MD ELECTRONICALLY SIGNED 05/31/2014 14:19

## 2015-04-11 NOTE — H&P (Signed)
PATIENT NAME:  [name], Tonya [name] MR#:  [id number] DATE OF BIRTH:  January 26, 1931  DATE OF ADMISSION:  06/19/2014  PRIMARY CARE PHYSICIAN: Tonya Nones, MD  CHIEF COMPLAINT: Fall at home.   HISTORY OF PRESENT ILLNESS: The patient is [name] poor historian. She has advanced dementia. Son who is at bedside in the Emergency Room gives me most of the history.   Tonya [name] is an 81 year old Caucasian female with past medical history of advanced dementia, hypertension, CKD stage [name], atrial fibrillation, not on anticoagulation secondary to be recurrent falls, was recently admitted in latter part of May/June due to UTI and rhabdomyolysis. She comes back again. She has caregivers during the daytime along with [facility] following her. The patient has been having recurrent falls, according to caregivers at home, and had one fall 2 days ago. She comes to the Emergency Room as her son was advised by [facility] to bring her to the Emergency Room for further evaluation and management. The patient was found to have elevated CPK with levels of 1800 with increased sodium and mild dehydration, likely due to acute rhabdomyolysis. She is being admitted for further evaluation and management.   REVIEW OF SYSTEMS: Unobtainable secondary to the patient having advanced dementia.   PAST MEDICAL HISTORY: 1.  Advanced dementia.  2.  Hypertension.  3.  Chronic kidney disease, stage [name]. 4.  [name], not on anticoagulation secondary to falls.  5.  Osteoporosis.  6.  Osteoarthritis.  7.  Parkinsonism.   PAST SURGICAL HISTORY:  1.  Thumb surgery.  2.  Hysterectomy.  3.  Appendectomy.  4.  Left knee replacement surgery.   ALLERGIES: CODEINE AND VICODIN.   SOCIAL HISTORY: She lives at home with caregivers 24/7. [facility] follows her. She has 2 sons, one is in [place] who is her son, Tonya [name], who is her healthcare power of attorney, and the other lives in [place]. No smoking or alcohol use.   CODE STATUS: DNR per son, Tonya [name].  FAMILY  HISTORY Unobtainable.  REVIEW OF SYSTEMS: Again, unobtainable secondary to the patient's mental status.  PHYSICAL EXAMINATION: GENERAL: The patient is somewhat lethargic, opens her eyes to verbal commands.  VITAL SIGNS: She is afebrile. Pulse is 68. Blood pressure is 122/64 and sats are 93% on room air.  HEENT: Atraumatic, normocephalic. Her right eye has some matting. No conjunctival congestion. Oral mucosa is dry.  NECK: Supple. No JVD. No carotid bruit.  RESPIRATORY: Decreased breath sounds bilaterally in bases. No respiratory distress or audible rales or rhonchi heard.  CARDIOVASCULAR: Both heart sounds are normal. Rate and rhythm regular. PMI not lateralized. Chest nontender.  EXTREMITIES: Good pedal pulses. ABDOMEN: Soft, benign, nontender. No organomegaly. Positive bowel sounds.  NEUROLOGIC: Unable to assess since the patient is not able to participate. She moves all her extremities well. No focal neuro deficit noted.  SKIN: Warm and dry. [name] present in upper and lower extremities.  PSYCHIATRIC: The patient is awake; however, she is confused secondary to her dementia.  DIAGNOSTIC DATA: EKG shows chronic [name], heart rate in the 80s.   CK total is 1847. MB is 8.4. Creatinine is 1.53. BUN is 23. Glucose is 112. Sodium is 147.  LFTs: SGOT is 95 and total protein is 5.7. White count is 10.9, H and H is 10.6 and 31.6, and platelet count is 184,000.   UA is negative for UTI.  Chest x-ray: No acute cardiopulmonary disease.   ASSESSMENT AND PLAN: An 81 year old, Tonya [name], with history  of advanced dementia, hypertension, chronic atrial fibrillation and not on anticoagulation due to recurrent falls, comes in with another fall 2 days ago. She is being admitted with:  1.  Acute recurrent rhabdomyolysis secondary to frequent falls at home. The patient has 24/7 care along with [facility] following. Despite that she appears to be having some accidental falls. No acute injury. We will  admit the patient to the medical floor, continue IV fluids, follow up CPK levels, and follow up sodium level as well since it appears the patient is dehydrated.  2.  Acute on chronic renal failure secondary to dehydration. Will continue IV fluids, will follow ins and outs, avoid nephrotoxins and monitor labs.  3.  Advanced dementia. Continue Namenda and doxepin.  4.  Fall from unsteady gait. We will get physical therapy to see the patient; however, with her Parkinson disease and dementia I am not sure how much physical therapy the patient will be able to perform. She likely needs long-term replacement as part of the safe discharge plan. She is not safe to return home. Discussed with care manager and the patient's son who was present in the Emergency Room and he is agreeable for long-term placement and would like care managers to work for placement somewhere near [place] to [name]. 5.  Chronic atrial fibrillation, rate controlled. Continue beta blockers. She was recently taken off Coumadin, suspect due to her recurrent falls. Agree for not giving her any anticoagulation at this time.  6.  Advanced dementia, pleasantly confused.  7.  Hypertension. We will continue home medications. Blood pressure is stable.  8.  Deep vein thrombosis prophylaxis. Subcu heparin t.i.d.   CODE STATUS: No code, DNR. This was confirmed again with son in the Emergency Room.   TIME SPENT: 50 minutes.   ____________________________ Tonya [name]. Tonya KatzPatel, MD sap:sb D: 06/19/2014 16:06:11 ET T: 06/19/2014 16:28:01 ET JOB#: [id number]  cc: Tonya [name]. Tonya KatzPatel, MD, <Dictator> Curtis SitesBert J. Klein III, MD Tonya [name] Tonya Clabo MD ELECTRONICALLY SIGNED 06/21/2014 10:36

## 2015-04-11 NOTE — Consult Note (Signed)
   Comments   I met with pt's son, Cleda Mccreedy. He is in agreement with SNF for STR. He is hopeful that pt can eventually return home with the 24/7 caregivers he has in place.  does not currently qualify for hospice in the home. However, I advised son that with inevitable decline, pt will eventually qualify and this may help her stay in her home which is what son wishes for her.  suggest that palliative care follow pt in SNF.   Electronic Signatures: Quinesha Selinger, Izora Gala (MD)  (Signed 01-Jun-15 14:29)  Authored: Palliative Care   Last Updated: 01-Jun-15 14:29 by Mckenzye Cutright, Izora Gala (MD)

## 2015-04-11 NOTE — Discharge Summary (Signed)
PATIENT NAME:  [name], [name] MR#:  [id number] DATE OF BIRTH:  07/02/1931  DATE OF ADMISSION:  06/19/2014 DATE OF DISCHARGE:  06/24/2014   DISCHARGE TO: Nursing facility.   FINAL DIAGNOSES:  1. Dehydration.  2. Rhabdomyolysis secondary to falls.  3. Metabolic encephalopathy.  4. Dementia with behavioral disturbance.  5. Parkinson's disease.  6. Impaired ambulation secondary to Parkinson's and osteoarthritis.  7. Atrial fibrillation, not on anticoagulation secondary to recurrent falls.  8. Chronic kidney disease stage III.  9. Hypertension.  10. History of osteoporosis.  11. Status post hysterectomy.  12. Status post appendectomy.  13. Status post left knee replacement surgery.   HISTORY AND PHYSICAL: Please see dictated admission history and physical.   HOSPITAL COURSE: The patient was admitted with increasing confusion, recurrent falls. Found to have evidence of rhabdomyolysis and dehydration. Had a similar admission about 1-1/2 months ago, and at that time had urinary tract infection in addition. She was placed on IV fluids, CKs trended downward. Her mental status continued to wax and wane; however, on day of discharge, she is awake, alert, eating, communicating at baseline.   The patient had been at home with caretakers; however, currently required enough help mobilizing that this was not a feasible option for her. Family members have been discussing moving her to a higher level of care, and clearly, she will need to move to higher level care for now. Hopefully we can see her respond to rehabilitation enough to become more independent and ambulatory, at least with assistance, so that she would have more options.   Her renal function returned to normal. Her blood pressure was reasonably stable. Losartan was held secondary to dehydration and relative hypotension; this may need to be restarted, but this would be left to the nursing home physician.   At this time, the patient will be  discharged to a nursing facility in stable condition, with physical activity to be up with a walker with assistance as tolerated. Physical therapy and occupational therapy should evaluate and treat the patient. Her diet will be no added salt. She will need a MET-B and CBC in 1 week, with results to the nursing home physician, and she will follow up with the nursing home physician as per their schedule.   DISCHARGE MEDICATIONS:  1. Donepezil 10 mg p.o. at bedtime.  2. Namenda XR 28 mg p.o. daily.  3. Seroquel 25 mg p.o. at bedtime.  4. Sinemet 25/100 two tablets p.o. t.i.d.  5. Toprol-XL 25 mg p.o. daily.  6. Aspirin 81 mg p.o. daily.  7. Amlodipine 10 mg p.o. daily.  8. Buspirone 15 mg p.o. b.i.d.   The patient is not a candidate for Coumadin or more intensive anticoagulation secondary to recurrent falls. Had previously been on losartan 100 mg daily, again this is being currently held secondary to hypotension.   CODE STATUS: The patient is do not resuscitate. An out-of-facility DNR form will be sent with her to the nursing facility.   DISCHARGE TIME: 35 minutes.   ____________________________ Tama High III, MD [id number] D: 06/24/2014 12:58:08 ET T: 06/24/2014 13:08:30 ET JOB#: [id number]  cc: Tama High III, MD, <Dictator> Patient Room [facility] MD ELECTRONICALLY SIGNED 07/02/2014 7:51

## 2015-06-11 IMAGING — CR DG CHEST 1V PORT
1 series · 2 of 2 positions shown · non-contrast
Comparison: Chest x-ray 08/11/2013.

CLINICAL DATA: Shortness of breath.

EXAM:
PORTABLE CHEST - 1 VIEW

[Series 1: x chest ap · 0.14mm/px · 2 of 2 slices shown]
[im 1/2]
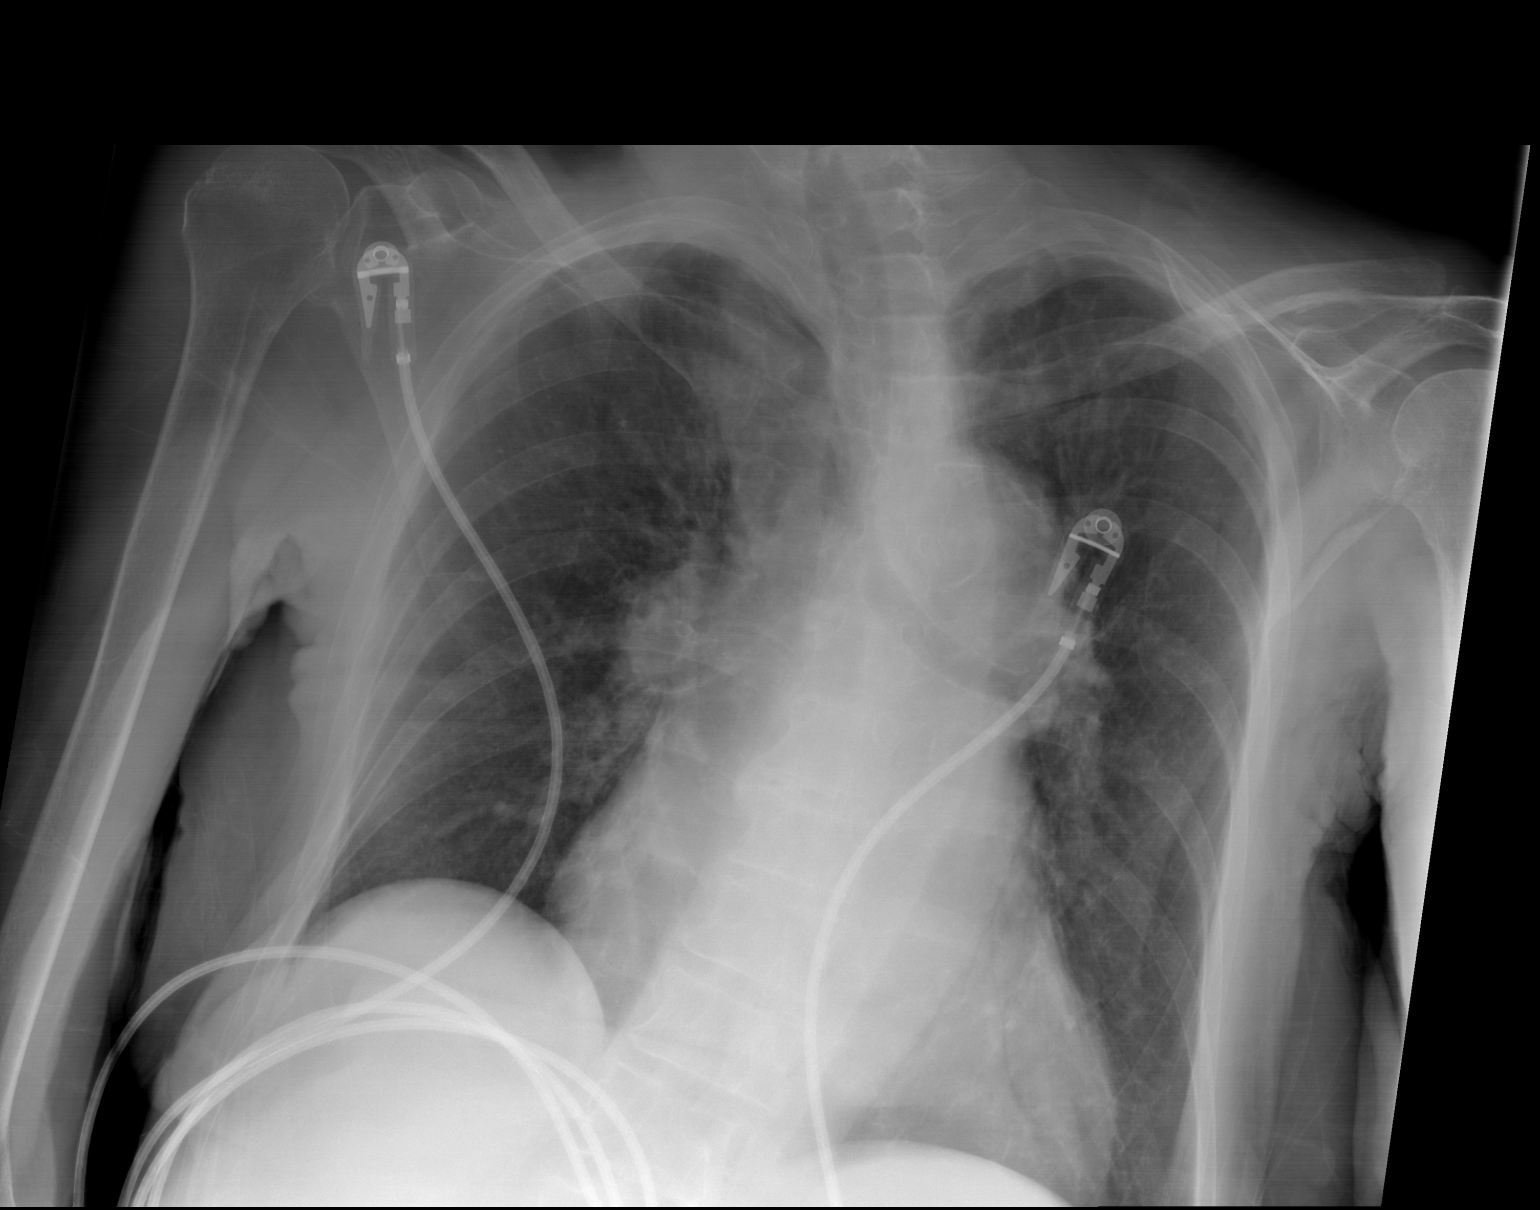
[im 2/2]
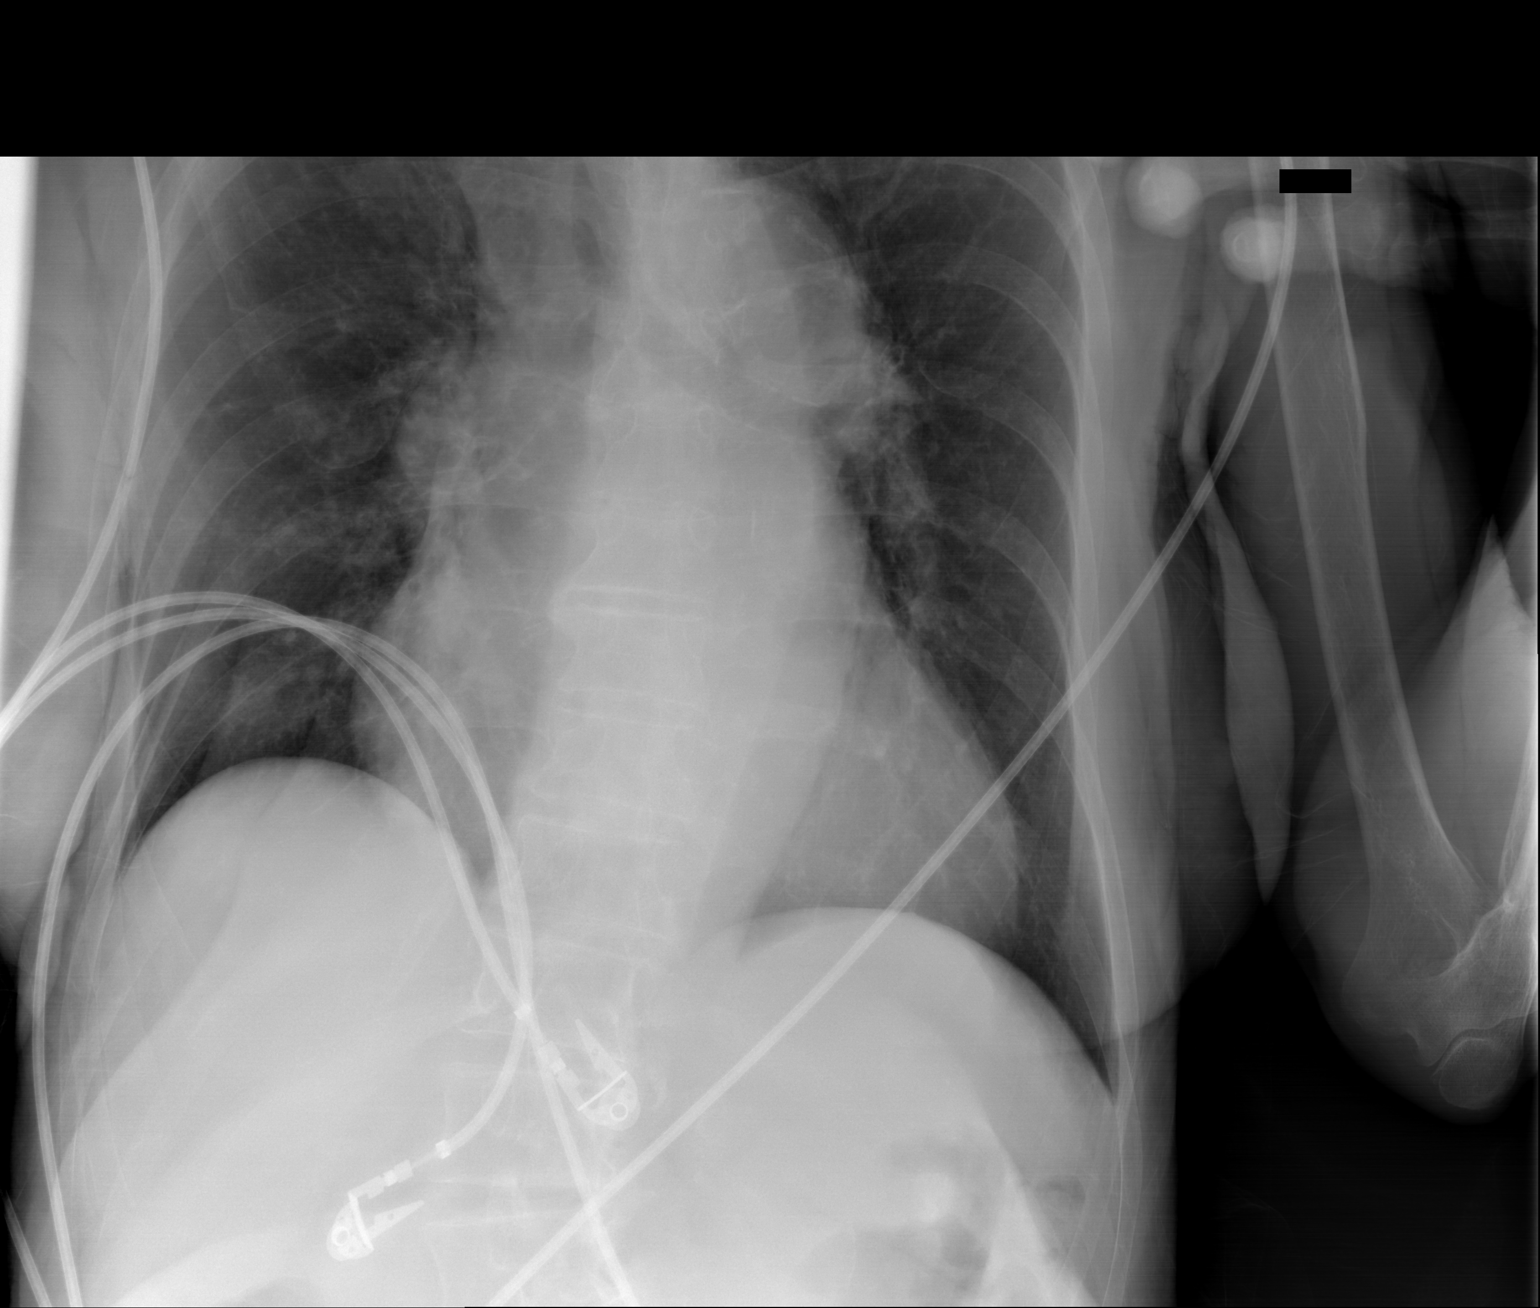

[2 of 2 positions shown; findings below may reference images not displayed]

FINDINGS: Mediastinum and hilar structures normal. Cardiomegaly with normal
pulmonary vascularity. No pleural effusion or pneumothorax. No focal
pulmonary infiltrate.
IMPRESSION: Cardiomegaly, no congestive heart failure. No focal pulmonary
infiltrate .

## 2015-06-11 IMAGING — CT CT HEAD WITHOUT CONTRAST
1 of 2 series · 16 of 30 positions shown, 20 images · non-contrast
Comparison: 08/11/2013

CLINICAL DATA: Fall.  Altered mental status.

EXAM:
CT HEAD WITHOUT CONTRAST
TECHNIQUE: Contiguous axial images were obtained from the base of the skull
through the vertex without intravenous contrast.

[Series 2: head wo · axial · 0.43mm/px · z∈[-44,+92]mm · 16 of 31 slices shown, 20 images]
[im 2/31  brain]
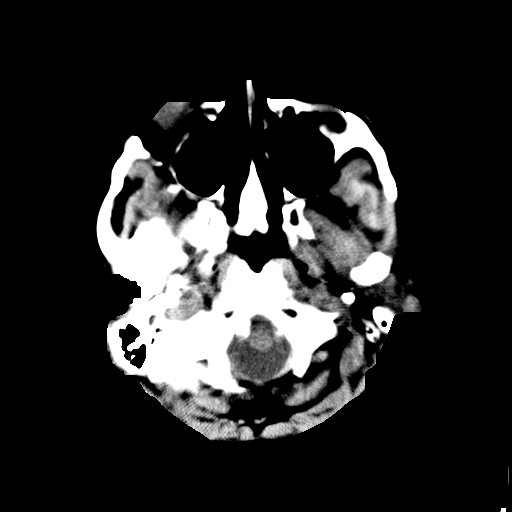
[im 2/31  bone]
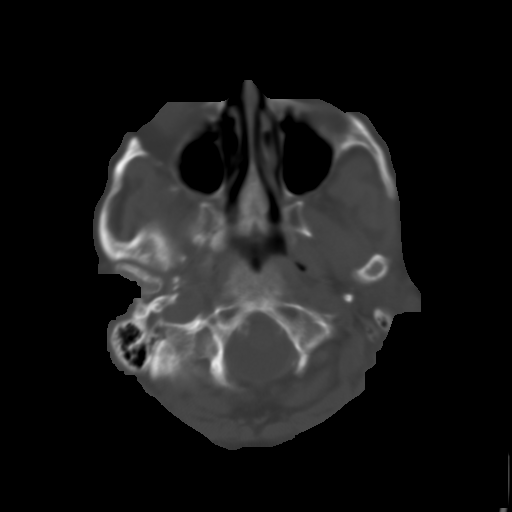
[im 3/31  brain]
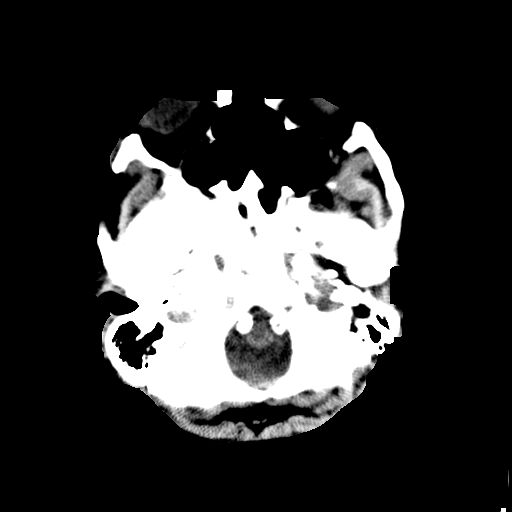
[im 6/31  brain]
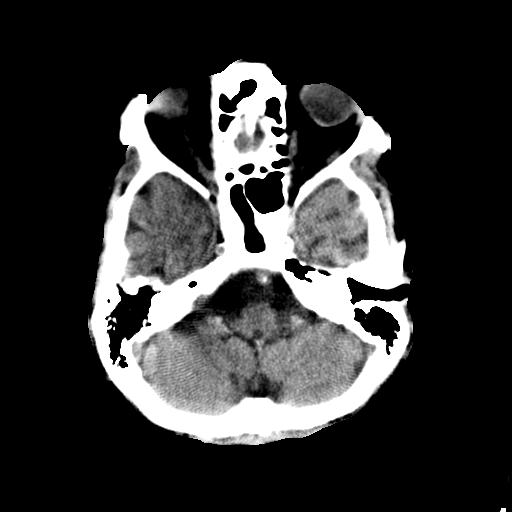
[im 8/31  brain]
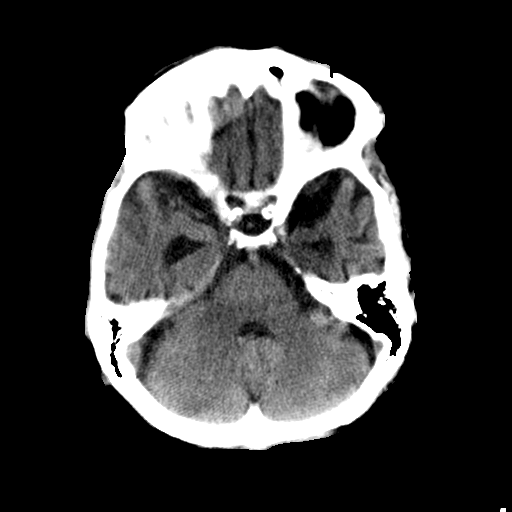
[im 9/31  brain]
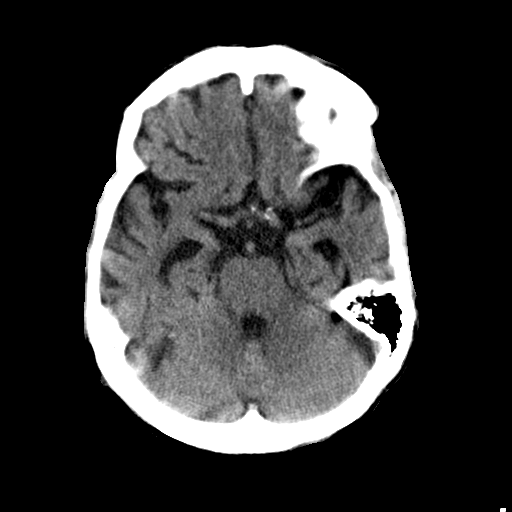
[im 9/31  bone]
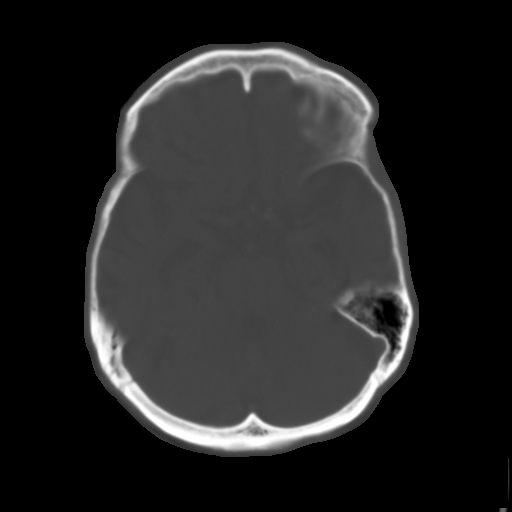
[im 11/31  brain]
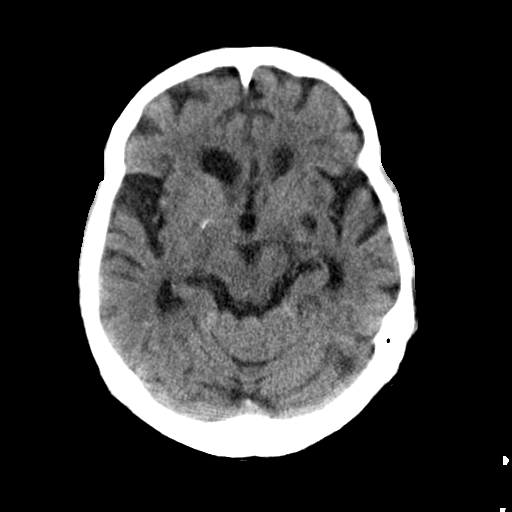
[im 13/31  brain]
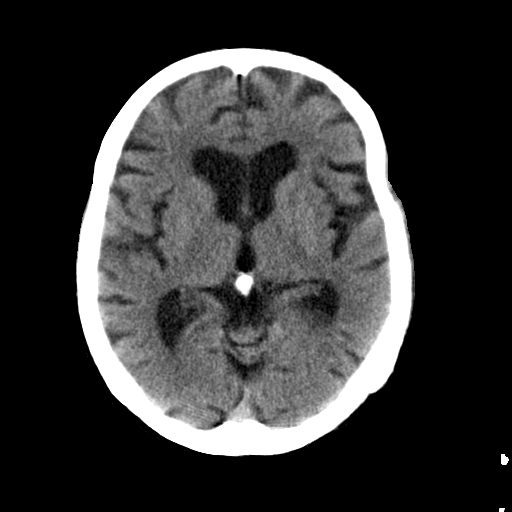
[im 15/31  brain]
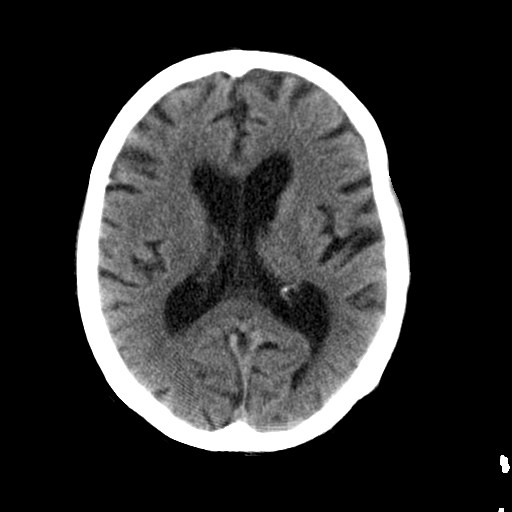
[im 16/31  brain]
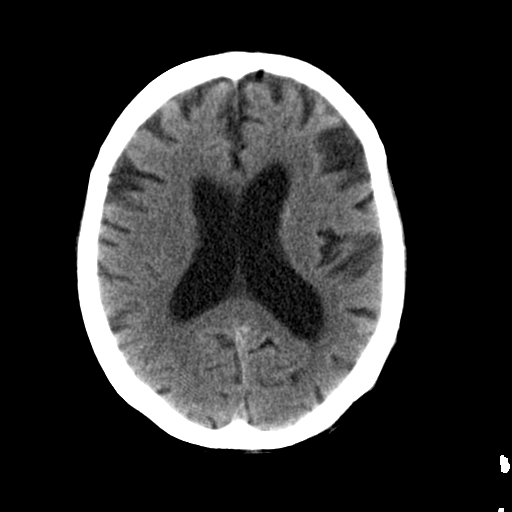
[im 16/31  bone]
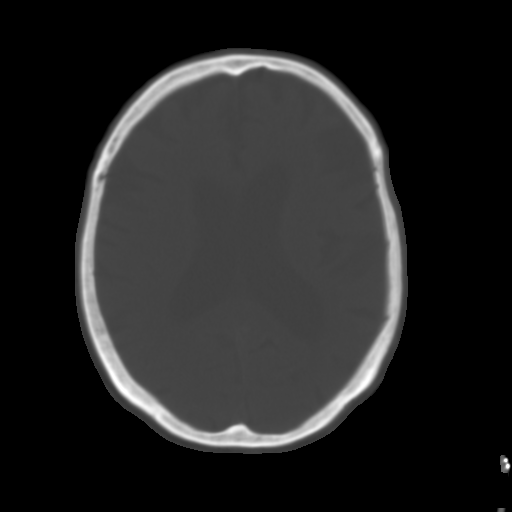
[im 18/31  brain]
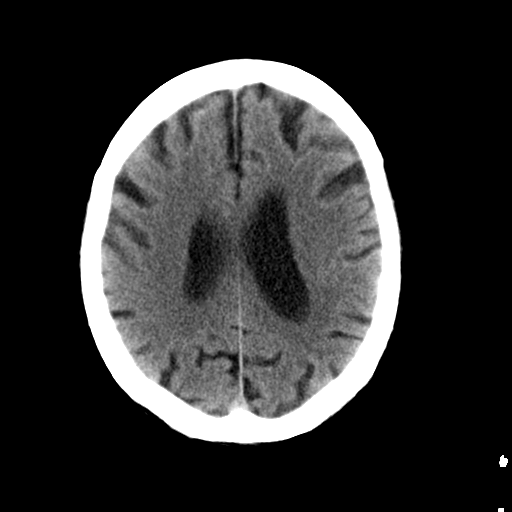
[im 21/31  brain]
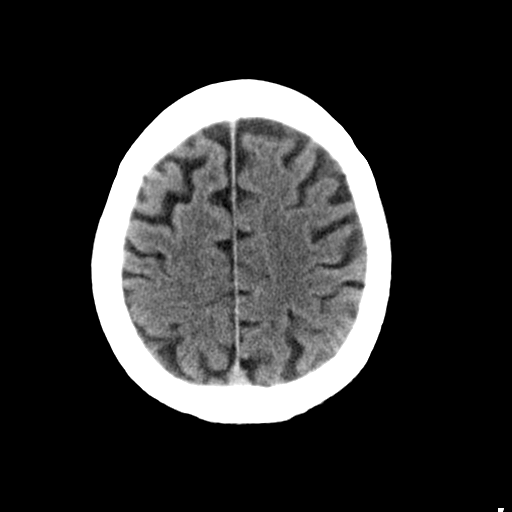
[im 22/31  brain]
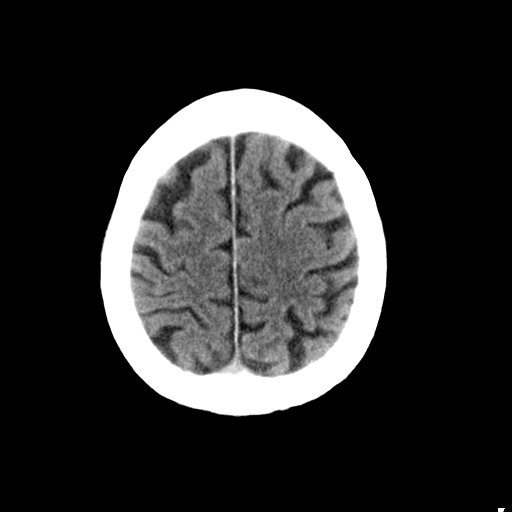
[im 23/31  brain]
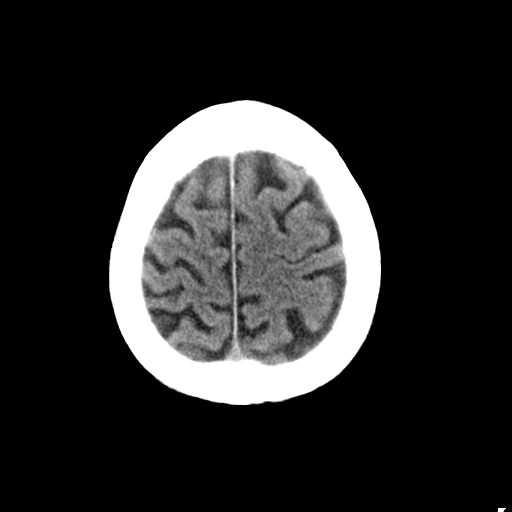
[im 23/31  bone]
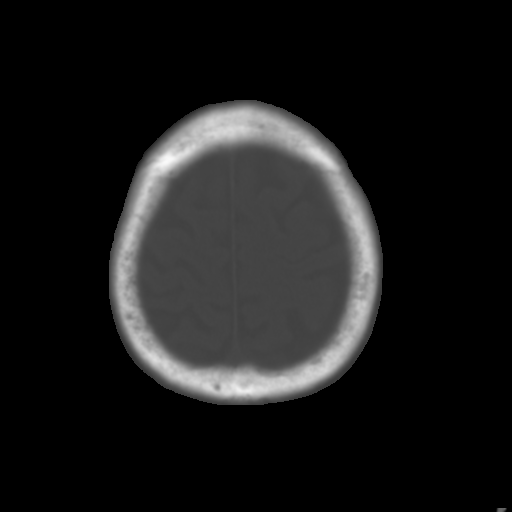
[im 25/31  brain]
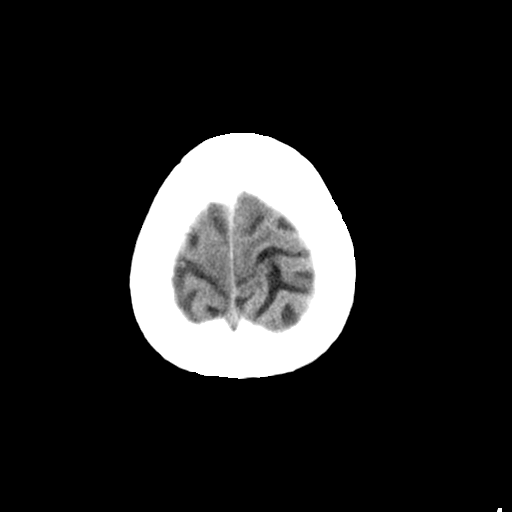
[im 28/31  brain]
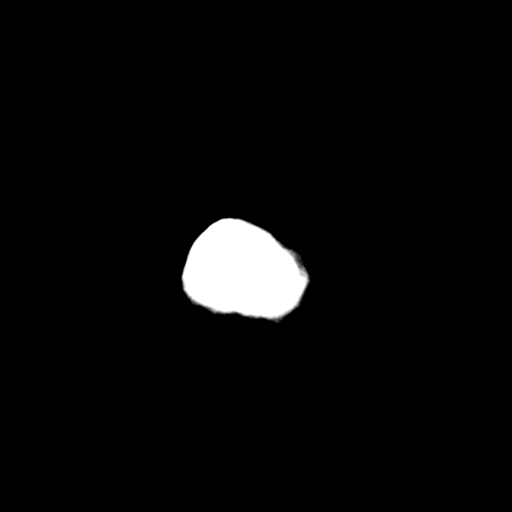
[im 29/31  brain]
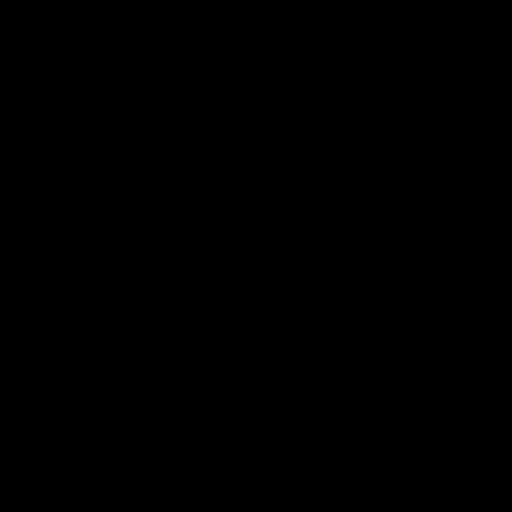

[16 of 30 positions shown; findings below may reference images not displayed]

FINDINGS: There is no evidence of intracranial hemorrhage, brain edema, or
other signs of acute infarction. There is no evidence of
intracranial mass lesion or mass effect. No abnormal extraaxial
fluid collections are identified.

Mild to moderate cerebral atrophy remains stable. Ventricles are
stable in size. No skull fracture identified.
IMPRESSION: No acute intracranial abnormality.

Stable cerebral atrophy.

## 2015-06-19 DEATH — deceased
